# Patient Record
Sex: Male | Born: 2000 | Race: Black or African American | Hispanic: No | Marital: Single | Smoking: Never smoker
Health system: Southern US, Community
[De-identification: ages and names within clinical notes are randomized; demographics above are authoritative.]

## PROBLEM LIST (undated history)

## (undated) DIAGNOSIS — F909 Attention-deficit hyperactivity disorder, unspecified type: Secondary | ICD-10-CM

## (undated) HISTORY — DX: Attention-deficit hyperactivity disorder, unspecified type: F90.9

---
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
## 2020-04-19 ENCOUNTER — Ambulatory Visit (INDEPENDENT_AMBULATORY_CARE_PROVIDER_SITE_OTHER): Admitting: Family Medicine

## 2020-04-19 ENCOUNTER — Other Ambulatory Visit: Payer: Self-pay

## 2020-04-19 ENCOUNTER — Encounter: Payer: Self-pay | Admitting: Family Medicine

## 2020-04-19 VITALS — BP 118/78 | HR 53 | Temp 98.1°F | Ht 73.5 in | Wt 184.0 lb

## 2020-04-19 DIAGNOSIS — Z114 Encounter for screening for human immunodeficiency virus [HIV]: Secondary | ICD-10-CM | POA: Diagnosis not present

## 2020-04-19 DIAGNOSIS — Z113 Encounter for screening for infections with a predominantly sexual mode of transmission: Secondary | ICD-10-CM | POA: Diagnosis not present

## 2020-04-19 DIAGNOSIS — Z23 Encounter for immunization: Secondary | ICD-10-CM

## 2020-04-19 DIAGNOSIS — L309 Dermatitis, unspecified: Secondary | ICD-10-CM | POA: Insufficient documentation

## 2020-04-19 DIAGNOSIS — F900 Attention-deficit hyperactivity disorder, predominantly inattentive type: Secondary | ICD-10-CM

## 2020-04-19 DIAGNOSIS — Z1159 Encounter for screening for other viral diseases: Secondary | ICD-10-CM

## 2020-04-19 DIAGNOSIS — L308 Other specified dermatitis: Secondary | ICD-10-CM

## 2020-04-19 NOTE — Assessment & Plan Note (Signed)
Trial of hydrocortisone as mild symptoms. Call if not improved and would do triamcinolone.

## 2020-04-19 NOTE — Progress Notes (Signed)
Subjective:     Anthony Blackburn is a 19 y.o. male presenting for Establish Care, Medication Refill (Adderall at a lower dose ), Referral (dermatology), and std testing     HPI  #ADHD - notes some difficulty focusing on tasks - is able to sit still w/o issues - having some difficulty focusing on home tasks  - does well at work  - sometimes he feels down  #STI testing - typically wears a condom  Review of Systems   Social History   Tobacco Use  Smoking Status Never Smoker  Smokeless Tobacco Never Used        Objective:    BP Readings from Last 3 Encounters:  04/19/20 118/78  12/12/17 114/60  05/30/17 (!) 108/58 (11 %, Z = -1.24 /  10 %, Z = -1.28)*   *BP percentiles are based on the 2017 AAP Clinical Practice Guideline for boys   Wt Readings from Last 3 Encounters:  04/19/20 184 lb (83.5 kg)  12/12/17 171 lb 3.2 oz (77.7 kg) (77 %, Z= 0.75)*  05/30/17 156 lb 6.4 oz (70.9 kg) (63 %, Z= 0.34)*   * Growth percentiles are based on CDC (Boys, 2-20 Years) data.    BP 118/78   Pulse (!) 53   Temp 98.1 F (36.7 C) (Temporal)   Ht 6' 1.5" (1.867 m)   Wt 184 lb (83.5 kg)   SpO2 98%   BMI 23.95 kg/m    Physical Exam Constitutional:      Appearance: Normal appearance. He is not ill-appearing or diaphoretic.  HENT:     Right Ear: External ear normal.     Left Ear: External ear normal.     Nose: Nose normal.  Eyes:     General: No scleral icterus.    Extraocular Movements: Extraocular movements intact.     Conjunctiva/sclera: Conjunctivae normal.  Cardiovascular:     Rate and Rhythm: Normal rate.  Pulmonary:     Effort: Pulmonary effort is normal.  Musculoskeletal:     Cervical back: Neck supple.  Skin:    General: Skin is warm and dry.     Comments: Small papules and dry skin around the nose and upper lip  Neurological:     Mental Status: He is alert. Mental status is at baseline.  Psychiatric:        Mood and Affect: Mood normal.            Assessment & Plan:   Problem List Items Addressed This Visit      Musculoskeletal and Integument   Eczema    Trial of hydrocortisone as mild symptoms. Call if not improved and would do triamcinolone.         Other   Attention deficit hyperactivity disorder (ADHD) - Primary    Rare symptoms reported on the adult symptom questionnaire and he reports doing well at work. Discussed watch and wait and if symptoms worsen/persist then return. He plans to enroll in school in Fall 2022 so may anticipate he will need medication while in school       Other Visit Diagnoses    Need for influenza vaccination       Relevant Orders   Flu Vaccine QUAD 36+ mos IM   Need for hepatitis C screening test       Relevant Orders   Hepatitis C antibody   Encounter for screening for HIV       Relevant Orders   HIV Antibody (routine testing w rflx)  Routine screening for STI (sexually transmitted infection)       Relevant Orders   C. trachomatis/N. gonorrhoeae RNA   RPR       Return in about 8 months (around 12/17/2020) for annual wellness and ADHD check in.  Lynnda Child, MD  This visit occurred during the SARS-CoV-2 public health emergency.  Safety protocols were in place, including screening questions prior to the visit, additional usage of staff PPE, and extensive cleaning of exam room while observing appropriate contact time as indicated for disinfecting solutions.

## 2020-04-19 NOTE — Assessment & Plan Note (Signed)
Rare symptoms reported on the adult symptom questionnaire and he reports doing well at work. Discussed watch and wait and if symptoms worsen/persist then return. He plans to enroll in school in Fall 2022 so may anticipate he will need medication while in school

## 2020-04-19 NOTE — Patient Instructions (Addendum)
Eczema  - try hydrocortisone cream over the counter - Call or MyChart if not improved - can send prescription for triamcinolone cream   Strategies that help with ADHD  1) To-Do List - Make a daily list of tasks you want to accomplish - break large tasks down into smaller ones that you can do with your attention span - Categorize tasks using ABC system:  ---"A" tasks are of most importance and need to be done more immediately ---"B" tasks may be important, but can wait ---"C" tasks are often easiest, but should not be done first  2) Time Management: set a timer for completing certain tasks. Consider using this to monitor breaks between tasks  3) Delaying Distractibility: When trying to focus on a task, consider having a small notebook to jot down ideas that interrupt concentration. This way you don't forget something and can redirect attention to the immediate task  4) Mindfulness: Try to do only one thing at a time. Multitasking often leads to mistakes and distractions.   5) Minimize Distractions: Limit potential distractions while you are trying to complete tasks. Quiet place, no background noise, consider turning off your phone.   6) Visual Reminders: Use sticky notes, white board, or small notebook to remind yourself of tasks -- and keep in ONE obvious place  7) Reminders/alerts: Set timers or alerts to reminder yourself of appointments.   8) Calendar/planner: Keep a calendar to maintain your schedule as well as manage your time. Put all deadlines, appointments, etc in the planner and schedule blocks of time for projects at home (exercising, and other goals). Make "appointments" with that time to protect that time for those tasks specifically.   Self-help Materials 1) Resources:  -- Building control surveyor but Scattered Guide to Success: How to Use Your Brains' Executive Skills to Keep Up, Stay Calm, and Get Organized at Work and at Home by Arita Miss and [place] -- Out of the Fog: Treatment Options  and Coping Strategies for Adult Attention Deficit Disorder by Bettina Gavia and Mickel Fuchs -- Driven to Distraction (Revised): Recognizing and Coping with Attention Deficit Disorder by Antonieta Loveless and Alvie Heidelberg -- Taking Charge of Adult ADHD by Janese Banks -- www.chadd.org and www.PinkCheek.be  2) Deep breathing - can be done in 3-5 minutes throughout the day.  -- Take a deep breath in through the nose (for 4 seconds), noticing yoru stomach expands as you inhale and take along breath out (for 6 seconds) noticing that your stomach deflates as you exhale. Apps like Calm and Breath2Relax provide short, guided breathing exercises.   3) Mindfulness: The goal is to concentrate on the present moment in a descriptive and non-judgmental manner -- Books: The Mindfulness Prescription for Adult ADHD: An 8-Step Program for Strengthening Your Attention, Managing Emotions, and Achieving Your Goals by Milas Kocher -- Apps: Insight Timer, Headspace, and Buddhify2 -- Websites: www.mindful.org and franticworld.com/free-meditations-from-mindfulness and YouTube videos by Charlene Brooke such as "Three Minute Breathing Space" -- Yoga practice   Healthy Habits 1) Try to maintain a healthy lifestyle -- eating lots of fruits and veggies and getting regular exercise  2) Good Sleep Hygiene Habits -- Got to bed and wake up within an hour of the same time every day -- Avoid bright screens (from laptop, phone, TV) within at least 30 minutes before bed. The "blue light" supresses the sleep hormone melatonin and the content may stimulate as well -- Avoid heavy foods and caffeine within at least 2 hours of bedtime -- Maintain a quiet  and dark sleep environment (blackout curtains, turn on a fan or white noise to block out disruptive sounds) -- Practicing relaxing activites before bed (taking a shower, reading a book, journaling, meditation app) -- To quiet a busy mid -- consider journaling before bed (jotting down  reminders, worry thoughts, as well as positive things like a gratitude list)

## 2020-04-22 LAB — HEPATITIS C ANTIBODY
Hepatitis C Ab: NONREACTIVE
SIGNAL TO CUT-OFF: 0.01 (ref <1.00)

## 2020-04-22 LAB — C. TRACHOMATIS/N. GONORRHOEAE RNA
C. trachomatis RNA, TMA: NOT DETECTED
N. gonorrhoeae RNA, TMA: DETECTED — AB

## 2020-04-22 LAB — RPR: RPR Ser Ql: NONREACTIVE

## 2020-04-22 LAB — HIV ANTIBODY (ROUTINE TESTING W REFLEX): HIV 1&2 Ab, 4th Generation: NONREACTIVE

## 2020-04-27 ENCOUNTER — Other Ambulatory Visit: Payer: Self-pay

## 2020-04-27 ENCOUNTER — Ambulatory Visit (INDEPENDENT_AMBULATORY_CARE_PROVIDER_SITE_OTHER)

## 2020-04-27 DIAGNOSIS — R369 Urethral discharge, unspecified: Secondary | ICD-10-CM

## 2020-04-27 MED ORDER — CEFTRIAXONE SODIUM 1 G IJ SOLR: 1.0000 g | Freq: Once | INTRAMUSCULAR | Status: DC

## 2020-04-27 MED ORDER — CEFTRIAXONE SODIUM 1 G IJ SOLR
1.0000 g | Freq: Once | INTRAMUSCULAR | Status: AC
  Administered 2020-04-27: 500 mg via INTRAMUSCULAR

## 2020-04-27 NOTE — Progress Notes (Signed)
Per orders of Dr. Selena Batten, injection of Ceftriaxone 500mg  reconstituted with 1% Lidocaine administered deep IM to Right ventrogluteal by , RN. Patient tolerated injection well.  Patient did wait 15-20 minutes following administration to ensure no reaction. Also, patient reviewed questions regarding treatment for gonorrhea with PCP, Dr. Kizzie Ide.

## 2020-12-14 ENCOUNTER — Other Ambulatory Visit: Payer: Self-pay

## 2020-12-14 ENCOUNTER — Ambulatory Visit (INDEPENDENT_AMBULATORY_CARE_PROVIDER_SITE_OTHER): Payer: Self-pay | Admitting: Family Medicine

## 2020-12-14 ENCOUNTER — Encounter: Payer: Self-pay | Admitting: Family Medicine

## 2020-12-14 VITALS — BP 126/70 | HR 61 | Temp 97.9°F | Ht 73.5 in | Wt 176.0 lb

## 2020-12-14 DIAGNOSIS — Z0000 Encounter for general adult medical examination without abnormal findings: Secondary | ICD-10-CM

## 2020-12-14 DIAGNOSIS — Z23 Encounter for immunization: Secondary | ICD-10-CM

## 2020-12-14 NOTE — Progress Notes (Signed)
Annual Exam   Chief Complaint:  Chief Complaint  Patient presents with   Annual Exam    History of Present Illness:  Anthony Blackburn is a 20 y.o. presents today for annual examination.     Nutrition/Lifestyle Diet: 1 meal per day, so busy with work so hard to eat Exercise: active job He is multiple partners, contraception - condoms most of the time and has sex with females.  Any issues getting or maintaining an erection? yes  Social History   Tobacco Use  Smoking Status Never  Smokeless Tobacco Never   Social History   Substance and Sexual Activity  Alcohol Use Yes   Comment: less than once per month   Social History   Substance and Sexual Activity  Drug Use Not Currently   Types: Marijuana   Comment: 2 months since last use     Safety The patient wears seatbelts: yes.     The patient feels safe at home and in their relationships: yes.  General Health Dentist in the last year: Yes Eye doctor: not applicable  Weight Wt Readings from Last 3 Encounters:  12/14/20 176 lb (79.8 kg)  04/19/20 184 lb (83.5 kg)  12/12/17 171 lb 3.2 oz (77.7 kg) (77 %, Z= 0.75)*   * Growth percentiles are based on CDC (Boys, 2-20 Years) data.   Patient has normal BMI  BMI Readings from Last 1 Encounters:  12/14/20 22.91 kg/m     Chronic disease screening Blood pressure monitoring:  BP Readings from Last 3 Encounters:  12/14/20 126/70  04/19/20 118/78  12/12/17 114/60    Lipid Monitoring: Indication for screening: age >35, obesity, diabetes, family hx, CV risk factors.  Lipid screening: Not Indicated  No results found for: CHOL, HDL, LDLCALC, LDLDIRECT, TRIG, CHOLHDL   Diabetes Screening: age >91, overweight, family hx, PCOS, hx of gestational diabetes, at risk ethnicity, elevated blood pressure >135/80.  Diabetes Screening screening: Not Indicated  No results found for: HGBA1C   Immunization History  Administered Date(s) Administered   DTaP 01/16/2001,  01/26/2004, 03/02/2006   HPV 9-valent 04/19/2020   Hepatitis A 12/21/2004, 03/02/2006   Hepatitis B 08/30/1999, 12/30/1999, 03/14/2000   HiB (PRP-OMP) 08/30/1999, 12/30/1999, 03/14/2000   IPV 01/25/2001, 01/17/2004, 03/02/2006   Influenza, Seasonal, Injecte, Preservative Fre 07/08/2012   Influenza,inj,Quad PF,6+ Mos 02/12/2013, 03/08/2015, 06/20/2016, 05/30/2017, 04/19/2020   MMR 01/26/2004, 03/02/2006   Pneumococcal Conjugate-13 08/30/1999, 12/30/1999   Tdap 11/28/2010   Varicella 06/20/2000, 03/02/2006    History reviewed. No pertinent past medical history.  History reviewed. No pertinent surgical history.  Prior to Admission medications   Medication Sig Start Date End Date Taking? Authorizing Provider  doxycycline (VIBRAMYCIN) 100 MG capsule Take by mouth. 12/12/20 12/19/20 Yes [provider]    Allergies  Allergen Reactions   Pork-Derived Products      Social History   Socioeconomic History   Marital status: Single    Spouse name: Not on file   Number of children: 0   Years of education: Not on file   Highest education level: Not on file  Occupational History   Not on file  Tobacco Use   Smoking status: Never   Smokeless tobacco: Never  Vaping Use   Vaping Use: Some days  Substance and Sexual Activity   Alcohol use: Yes    Comment: less than once per month   Drug use: Not Currently    Types: Marijuana    Comment: 2 months since last use   Sexual activity:  Yes    Birth control/protection: Condom    Comment: w/ women  Other Topics Concern   Not on file  Social History Narrative   04/19/20   From: the area   Living: with parents   Work: Psychologist, educational, 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         Family: lives with parents, one sister      Enjoys: relax, playing basketball      Exercise: occasional basketball game, no regular routine   Diet: not good, does eat home cooked - a lot of fast food       Safety   Seat belts: Yes    Guns: Yes  and secure   Safe in relationships: Yes    Social Determinants of Health   Financial Resource Strain: Not on file  Food Insecurity: Not on file  Transportation Needs: Not on file  Physical Activity: Not on file  Stress: Not on file  Social Connections: Not on file  Intimate Partner Violence: Not on file    Family History  Problem Relation Age of Onset   Heart attack Maternal Grandmother 21   ADD / ADHD Sister    ADD / ADHD Mother     Review of Systems  Constitutional:  Negative for chills and fever.  HENT:  Negative for congestion and sore throat.   Eyes:  Negative for blurred vision and double vision.  Respiratory:  Negative for shortness of breath.   Cardiovascular:  Negative for chest pain.  Gastrointestinal:  Negative for heartburn, nausea and vomiting.  Genitourinary: Negative.   Musculoskeletal: Negative.  Negative for myalgias.  Skin:  Negative for rash.  Neurological:  Negative for dizziness and headaches.  Endo/Heme/Allergies:  Does not bruise/bleed easily.  Psychiatric/Behavioral:  Negative for depression. The patient is not nervous/anxious.     Physical Exam BP 126/70   Pulse 61   Temp 97.9 F (36.6 C) (Temporal)   Ht 6' 1.5" (1.867 m)   Wt 176 lb (79.8 kg)   SpO2 97%   BMI 22.91 kg/m    BP Readings from Last 3 Encounters:  12/14/20 126/70  04/19/20 118/78  12/12/17 114/60      Physical Exam Constitutional:      General: He is not in acute distress.    Appearance: He is well-developed. He is not diaphoretic.  HENT:     Head: Normocephalic and atraumatic.     Right Ear: Tympanic membrane and ear canal normal.     Left Ear: Tympanic membrane and ear canal normal.     Nose: Nose normal.     Mouth/Throat:     Pharynx: Uvula midline.  Eyes:     General: No scleral icterus.    Conjunctiva/sclera: Conjunctivae normal.     Pupils: Pupils are equal, round, and reactive to light.  Cardiovascular:     Rate  and Rhythm: Normal rate and regular rhythm.     Heart sounds: Normal heart sounds. No murmur heard. Pulmonary:     Effort: Pulmonary effort is normal. No respiratory distress.     Breath sounds: Normal breath sounds. No wheezing.  Abdominal:     General: Bowel sounds are normal. There is no distension.     Palpations: Abdomen is soft. There is no mass.     Tenderness: There is no abdominal tenderness. There is no guarding.  Musculoskeletal:        General: Normal range of motion.  Cervical back: Normal range of motion and neck supple.  Lymphadenopathy:     Cervical: No cervical adenopathy.  Skin:    General: Skin is warm and dry.     Capillary Refill: Capillary refill takes less than 2 seconds.  Neurological:     Mental Status: He is alert and oriented to person, place, and time.       Results:  PHQ-9:  [place] Office Visit from 04/19/2020 in [place] [facility] [place]  PHQ-9 Total Score 5         Assessment: 20 y.o. here for routine annual physical examination.  Plan: Problem List Items Addressed This Visit   None Visit Diagnoses     Annual physical exam    -  Primary       Screening: -- Blood pressure screen normal -- cholesterol screening: not due for screening -- Weight screening: normal -- Diabetes Screening: not due for screening -- Nutrition: Encouraged healthy diet and exercise  The ASCVD Risk score Mikey Bussing DC Jr., et al., 2013) failed to calculate for the following reasons:   The 2013 ASCVD risk score is only valid for ages 29 to 91  -- Statin therapy for Age 85-75 with CVD risk >7.5%  Psych -- Depression screening (PHQ-9):  [place] Office Visit from 04/19/2020 in [place] at [facility]  PHQ-9 Total Score 5        Safety -- tobacco screening: not using -- alcohol screening:  low-risk usage. -- no evidence of domestic violence or intimate partner violence.   Cancer Screening -- No age related cancer  screening due  Immunizations Immunization History  Administered Date(s) Administered   DTaP 01/16/2001, 01/26/2004, 03/02/2006   HPV 9-valent 04/19/2020   Hepatitis A 12/21/2004, 03/02/2006   Hepatitis B 08/30/1999, 12/30/1999, 03/14/2000   HiB (PRP-OMP) 08/30/1999, 12/30/1999, 03/14/2000   IPV 01/25/2001, 01/17/2004, 03/02/2006   Influenza, Seasonal, Injecte, Preservative Fre 07/08/2012   Influenza,inj,Quad PF,6+ Mos 02/12/2013, 03/08/2015, 06/20/2016, 05/30/2017, 04/19/2020   MMR 01/26/2004, 03/02/2006   Pneumococcal Conjugate-13 08/30/1999, 12/30/1999   Tdap 11/28/2010   Varicella 06/20/2000, 03/02/2006    -- flu vaccine not up to date - not in season -- TDAP q10 years not up to date - will get today -- Covid-19 Vaccine pt declined   Encouraged regular vision and dental screening. Encouraged healthy exercise and diet.   Lesleigh Noe

## 2020-12-14 NOTE — Patient Instructions (Signed)
Preventive Care 21-21 Years Old, Male Preventive care refers to lifestyle choices and visits with your health care provider that can promote health and wellness. This includes: A yearly physical exam. This is also called an annual wellness visit. Regular dental and eye exams. Immunizations. Screening for certain conditions. Healthy lifestyle choices, such as: Eating a healthy diet. Getting regular exercise. Not using drugs or products that contain nicotine and tobacco. Limiting alcohol use. What can I expect for my preventive care visit? Physical exam Your health care provider may check your: Height and weight. These may be used to calculate your BMI (body mass index). BMI is a measurement that tells if you are at a healthy weight. Heart rate and blood pressure. Body temperature. Skin for abnormal spots. Counseling Your health care provider may ask you questions about your: Past medical problems. Family's medical history. Alcohol, tobacco, and drug use. Emotional well-being. Home life and relationship well-being. Sexual activity. Diet, exercise, and sleep habits. Work and work environment. Access to firearms. What immunizations do I need?  Vaccines are usually given at various ages, according to a schedule. Your health care provider will recommend vaccines for you based on your age, medicalhistory, and lifestyle or other factors, such as travel or where you work. What tests do I need? Blood tests Lipid and cholesterol levels. These may be checked every 5 years starting at age 20. Hepatitis C test. Hepatitis B test. Screening  Diabetes screening. This is done by checking your blood sugar (glucose) after you have not eaten for a while (fasting). Genital exam to check for testicular cancer or hernias. STD (sexually transmitted disease) testing, if you are at risk. Talk with your health care provider about your test results, treatment options,and if necessary, the need for more  tests. Follow these instructions at home: Eating and drinking  Eat a healthy diet that includes fresh fruits and vegetables, whole grains, lean protein, and low-fat dairy products. Drink enough fluid to keep your urine pale yellow. Take vitamin and mineral supplements as recommended by your health care provider. Do not drink alcohol if your health care provider tells you not to drink. If you drink alcohol: Limit how much you have to 0-2 drinks a day. Be aware of how much alcohol is in your drink. In the [place] one drink equals one 12 oz bottle of beer (355 mL), one 5 oz glass of wine (148 mL), or one 1 oz glass of hard liquor (44 mL).  Lifestyle Take daily care of your teeth and gums. Brush your teeth every morning and night with fluoride toothpaste. Floss one time each day. Stay active. Exercise for at least 30 minutes 5 or more days each week. Do not use any products that contain nicotine or tobacco, such as cigarettes, e-cigarettes, and chewing tobacco. If you need help quitting, ask your health care provider. Do not use drugs. If you are sexually active, practice safe sex. Use a condom or other form of protection to prevent STIs (sexually transmitted infections). Find healthy ways to cope with stress, such as: Meditation, yoga, or listening to music. Journaling. Talking to a trusted person. Spending time with friends and family. Safety Always wear your seat belt while driving or riding in a vehicle. Do not drive: If you have been drinking alcohol. Do not ride with someone who has been drinking. When you are tired or distracted. While texting. Wear a helmet and other protective equipment during sports activities. If you have firearms in your house, make sure   you follow all gun safety procedures. Seek help if you have been physically or sexually abused. What's next? Go to your health care provider once a year for an annual wellness visit. Ask your health care provider how often  you should have your eyes and teeth checked. Stay up to date on all vaccines. This information is not intended to replace advice given to you by your health care provider. Make sure you discuss any questions you have with your healthcare provider. Document Revised: 01/29/2019 Document Reviewed: 05/09/2018 Elsevier Patient Education  2022 Elsevier Inc.  

## 2022-08-01 ENCOUNTER — Encounter: Payer: Self-pay | Admitting: Nurse Practitioner

## 2022-08-01 ENCOUNTER — Ambulatory Visit: Payer: BC Managed Care – PPO | Admitting: Nurse Practitioner

## 2022-08-01 VITALS — BP 122/76 | HR 59 | Temp 98.6°F | Ht 75.0 in | Wt 199.2 lb

## 2022-08-01 DIAGNOSIS — F909 Attention-deficit hyperactivity disorder, unspecified type: Secondary | ICD-10-CM

## 2022-08-01 DIAGNOSIS — Z79899 Other long term (current) drug therapy: Secondary | ICD-10-CM

## 2022-08-01 DIAGNOSIS — Z23 Encounter for immunization: Secondary | ICD-10-CM | POA: Diagnosis not present

## 2022-08-01 MED ORDER — AMPHETAMINE-DEXTROAMPHET ER 20 MG PO CP24: 20.0000 mg | ORAL_CAPSULE | Freq: Every day | ORAL | 0 refills | Status: AC

## 2022-08-01 NOTE — Assessment & Plan Note (Signed)
Chronic, not controlled.  He is going back to school and would like to restart his Adderall.  Will have him start on Adderall XR 20 mg daily.  PDMP reviewed.  Discussed that this is a controlled substance and controlled substance agreement signed.  Discussed possible side effects.  Follow-up in 3 months.

## 2022-08-01 NOTE — Progress Notes (Signed)
New Patient Visit  BP 122/76 (BP Location: Right Arm)   Pulse (!) 59   Temp 98.6 F (37 C)   Ht '6\' 3"'$  (1.905 m)   Wt 199 lb 3.2 oz (90.4 kg)   SpO2 98%   BMI 24.90 kg/m    Subjective:    Patient ID: Anthony Blackburn, male    DOB: [birth year], 22 y.o.   MRN: [id number]  CC: Chief Complaint  Patient presents with   Transitions Of Care    Wants to start back on Adderall    HPI: Anthony Blackburn is a 22 y.o. male presents to transfer care to a new provider.  Introduced to Designer, jewellery role and practice setting.  All questions answered.  Discussed provider/patient relationship and expectations.  He has a history of ADHD. He is going back to school and woud like to restart adderall. He was taking adderall XR 30 mg daily, however doesn't know if he would need that dose again. He endorses trouble concentrating and staying on task.   Depression and Anxiety Screen Done:     08/01/2022    8:28 AM 04/19/2020   12:50 PM 12/12/2017    9:01 AM  Depression screen PHQ 2/9  Decreased Interest 2 1 0  Down, Depressed, Hopeless 1 1 0  PHQ - 2 Score 3 2 0  Altered sleeping 0 1   Tired, decreased energy 1 1   Change in appetite 0 0   Feeling bad or failure about yourself  0 1   Trouble concentrating 1 0   Moving slowly or fidgety/restless 2 0   Suicidal thoughts 0 0   PHQ-9 Score 7 5   Difficult doing work/chores Very difficult Not difficult at all       08/01/2022    8:29 AM  GAD 7 : Generalized Anxiety Score  Nervous, Anxious, on Edge 1  Control/stop worrying 1  Worry too much - different things 2  Trouble relaxing 3  Restless 2  Easily annoyed or irritable 2  Afraid - awful might happen 0  Total GAD 7 Score 11  Anxiety Difficulty Very difficult     Past Medical History:  Diagnosis Date   ADHD     History reviewed. No pertinent surgical history.  Family History  Problem Relation Age of Onset   Heart attack Maternal Grandmother 59   ADD / ADHD Sister    ADD /  ADHD Mother      Social History   Tobacco Use   Smoking status: Never   Smokeless tobacco: Never  Vaping Use   Vaping Use: Some days  Substance Use Topics   Alcohol use: Yes    Comment: less than once per month   Drug use: Not Currently    Types: Marijuana    Comment: 2 months since last use    No current outpatient medications on file prior to visit.   No current facility-administered medications on file prior to visit.     Review of Systems  Constitutional: Negative.   HENT: Negative.    Eyes: Negative.   Respiratory: Negative.    Cardiovascular: Negative.   Gastrointestinal: Negative.   Genitourinary: Negative.   Musculoskeletal: Negative.   Skin: Negative.   Neurological: Negative.   Psychiatric/Behavioral: Negative.        Objective:    BP 122/76 (BP Location: Right Arm)   Pulse (!) 59   Temp 98.6 F (37 C)   Ht '6\' 3"'$  (1.905 m)   Wt  199 lb 3.2 oz (90.4 kg)   SpO2 98%   BMI 24.90 kg/m   Wt Readings from Last 3 Encounters:  08/01/22 199 lb 3.2 oz (90.4 kg)  12/14/20 176 lb (79.8 kg)  04/19/20 184 lb (83.5 kg)    BP Readings from Last 3 Encounters:  08/01/22 122/76  12/14/20 126/70  04/19/20 118/78    Physical Exam Vitals and nursing note reviewed.  Constitutional:      General: He is not in acute distress.    Appearance: Normal appearance.  HENT:     Head: Normocephalic and atraumatic.     Right Ear: Tympanic membrane, ear canal and external ear normal.     Left Ear: Tympanic membrane, ear canal and external ear normal.  Eyes:     Conjunctiva/sclera: Conjunctivae normal.  Cardiovascular:     Rate and Rhythm: Normal rate and regular rhythm.     Pulses: Normal pulses.     Heart sounds: Normal heart sounds.  Pulmonary:     Effort: Pulmonary effort is normal.     Breath sounds: Normal breath sounds.  Abdominal:     Palpations: Abdomen is soft.     Tenderness: There is no abdominal tenderness.  Musculoskeletal:        General: Normal range  of motion.     Cervical back: Normal range of motion and neck supple. No tenderness.     Right lower leg: No edema.     Left lower leg: No edema.  Lymphadenopathy:     Cervical: No cervical adenopathy.  Skin:    General: Skin is warm and dry.  Neurological:     General: No focal deficit present.     Mental Status: He is alert and oriented to person, place, and time.     Cranial Nerves: No cranial nerve deficit.     Coordination: Coordination normal.     Gait: Gait normal.  Psychiatric:        Mood and Affect: Mood normal.        Behavior: Behavior normal.        Thought Content: Thought content normal.        Judgment: Judgment normal.        Assessment & Plan:   Problem List Items Addressed This Visit       Other   Attention deficit hyperactivity disorder (ADHD)    Chronic, not controlled.  He is going back to school and would like to restart his Adderall.  Will have him start on Adderall XR 20 mg daily.  PDMP reviewed.  Discussed that this is a controlled substance and controlled substance agreement signed.  Discussed possible side effects.  Follow-up in 3 months.      Relevant Orders   DRUG MONITORING, PANEL 8 WITH CONFIRMATION, URINE   Controlled substance agreement signed    Controlled substance agreement signed for Adderall XR 20 mg daily.  Will check urine drug screen today.      Relevant Orders   DRUG MONITORING, PANEL 8 WITH CONFIRMATION, URINE   Other Visit Diagnoses     Immunization due    -  Primary   HPV #3 given today   Relevant Orders   HPV 9-valent vaccine,Recombinat (Completed)        Follow up plan: Return in about 3 months (around 11/01/2022) for adhd.

## 2022-08-01 NOTE — Assessment & Plan Note (Signed)
Controlled substance agreement signed for Adderall XR 20 mg daily.  Will check urine drug screen today.

## 2022-08-01 NOTE — Patient Instructions (Signed)
It was great to see you!  Start adderall XR 1 tablet ('20mg'$ ) daily as needed.   Let's follow-up in 3 months, sooner if you have concerns.  If a referral was placed today, you will be contacted for an appointment. Please note that routine referrals can sometimes take up to 3-4 weeks to process. Please call our office if you haven't heard anything after this time frame.  Take care,  Vance Peper, NP

## 2022-08-03 LAB — DRUG MONITORING, PANEL 8 WITH CONFIRMATION, URINE
6 Acetylmorphine: NEGATIVE ng/mL (ref <10)
Alcohol Metabolites: NEGATIVE ng/mL (ref <500)
Amphetamines: NEGATIVE ng/mL (ref <500)
Benzodiazepines: NEGATIVE ng/mL (ref <100)
Buprenorphine, Urine: NEGATIVE ng/mL (ref <5)
Cocaine Metabolite: NEGATIVE ng/mL (ref <150)
Creatinine: 188.3 mg/dL (ref >=20.0)
MDMA: NEGATIVE ng/mL (ref <500)
Marijuana Metabolite: 7 ng/mL — ABNORMAL HIGH (ref <5)
Marijuana Metabolite: POSITIVE ng/mL — AB (ref <20)
Opiates: NEGATIVE ng/mL (ref <100)
Oxidant: NEGATIVE ug/mL (ref <200)
Oxycodone: NEGATIVE ng/mL (ref <100)
pH: 7.4 (ref 4.5–9.0)

## 2022-08-03 LAB — DM TEMPLATE

## 2022-11-01 ENCOUNTER — Encounter: Payer: Self-pay | Admitting: Family Medicine

## 2022-11-01 ENCOUNTER — Ambulatory Visit (INDEPENDENT_AMBULATORY_CARE_PROVIDER_SITE_OTHER): Payer: BC Managed Care – PPO | Admitting: Family Medicine

## 2022-11-01 VITALS — BP 120/74 | HR 96 | Temp 98.1°F | Ht 75.0 in | Wt 201.0 lb

## 2022-11-01 DIAGNOSIS — F909 Attention-deficit hyperactivity disorder, unspecified type: Secondary | ICD-10-CM

## 2022-11-01 NOTE — Progress Notes (Signed)
   Established Patient Office Visit   Subjective:  Patient ID: Anthony Blackburn, male    DOB: Mar 18, 2001  Age: 22 y.o. MRN: [id number]  Chief Complaint  Patient presents with   Medical Management of Chronic Issues    3 month f/u ADHD.  No concerns.     HPI Encounter Diagnoses  Name Primary?   Attention deficit hyperactivity disorder (ADHD), unspecified ADHD type Yes   For follow-up of above.  Has been taking Adderall intermittently.  Has not really noticed any difference with or without the medication.  He is back in school studying computer science and doing well without it.  He is aware of his recently positive drug screen.  He has been working on cleaning that area of his life.  He would like to try life without stimulants.  He has a history of attention deficit disorder since childhood.   Review of Systems  Constitutional: Negative.   HENT: Negative.    Eyes:  Negative for blurred vision, discharge and redness.  Respiratory: Negative.    Cardiovascular: Negative.   Gastrointestinal:  Negative for abdominal pain.  Genitourinary: Negative.   Musculoskeletal: Negative.  Negative for myalgias.  Skin:  Negative for rash.  Neurological:  Negative for tingling, loss of consciousness and weakness.  Endo/Heme/Allergies:  Negative for polydipsia.     Current Outpatient Medications:    amphetamine-dextroamphetamine (ADDERALL XR) 20 MG 24 hr capsule, Take 1 capsule (20 mg total) by mouth daily., Disp: 30 capsule, Rfl: 0   Objective:     BP 120/74   Pulse 96   Temp 98.1 F (36.7 C) (Temporal)   Ht 6\' 3"  (1.905 m)   Wt 201 lb (91.2 kg)   SpO2 97%   BMI 25.12 kg/m    Physical Exam Constitutional:      General: He is not in acute distress.    Appearance: Normal appearance. He is not ill-appearing, toxic-appearing or diaphoretic.  HENT:     Head: Normocephalic and atraumatic.     Right Ear: External ear normal.     Left Ear: External ear normal.  Eyes:     General: No  scleral icterus.       Right eye: No discharge.        Left eye: No discharge.     Extraocular Movements: Extraocular movements intact.     Conjunctiva/sclera: Conjunctivae normal.  Pulmonary:     Effort: Pulmonary effort is normal. No respiratory distress.  Skin:    General: Skin is warm and dry.  Neurological:     Mental Status: He is alert and oriented to person, place, and time.  Psychiatric:        Mood and Affect: Mood normal.        Behavior: Behavior normal.      No results found for any visits on 11/01/22.    The ASCVD Risk score (Arnett DK, et al., 2019) failed to calculate for the following reasons:   The 2019 ASCVD risk score is only valid for ages 53 to 38    Assessment & Plan:   Attention deficit hyperactivity disorder (ADHD), unspecified ADHD type    Return if symptoms worsen or fail to improve.  Advised that there are other medications available for attention deficit.  He would like to try school without it entirely.  He will follow back up as needed.  Mliss Sax, MD

## 2023-02-05 ENCOUNTER — Encounter: Payer: Self-pay | Admitting: Family Medicine

## 2023-02-05 ENCOUNTER — Ambulatory Visit (INDEPENDENT_AMBULATORY_CARE_PROVIDER_SITE_OTHER): Payer: 59 | Admitting: Family Medicine

## 2023-02-05 VITALS — BP 124/68 | HR 62 | Resp 18 | Ht 75.0 in | Wt 193.8 lb

## 2023-02-05 DIAGNOSIS — G8929 Other chronic pain: Secondary | ICD-10-CM | POA: Diagnosis not present

## 2023-02-05 DIAGNOSIS — M25572 Pain in left ankle and joints of left foot: Secondary | ICD-10-CM | POA: Diagnosis not present

## 2023-02-05 MED ORDER — MELOXICAM 7.5 MG PO TABS: 7.5000 mg | ORAL_TABLET | Freq: Every day | ORAL | 0 refills | Status: DC

## 2023-02-05 NOTE — Progress Notes (Signed)
Established Patient Office Visit   Subjective:  Patient ID: Anthony Blackburn, male    DOB: 2000/12/08  Age: 22 y.o. MRN: [id number]  Chief Complaint  Patient presents with   Follow-up    L ankle pain onset 02/01/2023, pt has had ankle problem in the past  Has gone to UC, however pt is still having pain  No fall, pain is from wear and tear  Would like a referral to orthopedist     HPI Encounter Diagnoses  Name Primary?   Chronic pain of left ankle Yes   Ongoing history of left ankle pain.  The left leg foot is what he lives with.  History of inversions in the past.  It does seem to be a weak ankle.  There has been pain radiating up of the knee to the medial distal tibia.  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   Review of Systems  Constitutional: Negative.   HENT: Negative.    Eyes:  Negative for blurred vision, discharge and redness.  Respiratory: Negative.    Cardiovascular: Negative.   Gastrointestinal:  Negative for abdominal pain.  Genitourinary: Negative.   Musculoskeletal:  Positive for joint pain. Negative for myalgias.  Skin:  Negative for rash.  Neurological:  Negative for tingling, loss of consciousness and weakness.  Endo/Heme/Allergies:  Negative for polydipsia.     Current Outpatient Medications:    amphetamine-dextroamphetamine (ADDERALL XR) 20 MG 24 hr capsule, Take 1 capsule (20 mg total) by mouth daily., Disp: 30 capsule, Rfl: 0   meloxicam (MOBIC) 7.5 MG tablet, Take 1 tablet (7.5 mg total) by mouth daily., Disp: 30 tablet, Rfl: 0   Objective:     BP 124/68 (BP Location: Left Arm, Patient Position: Sitting, Cuff Size: Normal)   Pulse 62   Resp 18   Ht 6\' 3"  (1.905 m)   Wt 193 lb 12.8 oz (87.9 kg)   SpO2 97%   BMI 24.22 kg/m    Physical Exam Constitutional:      General: He is not in acute distress.    Appearance: Normal appearance. He is not ill-appearing, toxic-appearing or diaphoretic.  HENT:     Head: Normocephalic and atraumatic.      Right Ear: External ear normal.     Left Ear: External ear normal.  Eyes:     General: No scleral icterus.       Right eye: No discharge.        Left eye: No discharge.     Extraocular Movements: Extraocular movements intact.     Conjunctiva/sclera: Conjunctivae normal.  Pulmonary:     Effort: Pulmonary effort is normal. No respiratory distress.  Musculoskeletal:     Left ankle: No swelling. Tenderness present. No base of 5th metatarsal tenderness. Normal range of motion. Anterior drawer test negative. Normal pulse.     Left Achilles Tendon: No tenderness or defects. Thompson's test negative.     Comments: Pain in the lateral malleolus with varus stress.  There was some laxity varus well.  Skin:    General: Skin is warm and dry.  Neurological:     Mental Status: He is alert and oriented to person, place, and time.  Psychiatric:        Mood and Affect: Mood normal.        Behavior: Behavior normal.      No results found for any visits on 02/05/23.    The ASCVD Risk score (Arnett DK, et al., 2019) failed to calculate  for the following reasons:   The 2019 ASCVD risk score is only valid for ages 72 to 42    Assessment & Plan:   Chronic pain of left ankle -     Meloxicam; Take 1 tablet (7.5 mg total) by mouth daily.  Dispense: 30 tablet; Refill: 0 -     Ambulatory referral to Orthopedic Surgery    No follow-ups on file.  Referred to orthopedics.  Will start on meloxicam 7.5 daily.  Mliss Sax, MD

## 2023-02-08 ENCOUNTER — Encounter: Payer: Self-pay | Admitting: Family Medicine

## 2023-03-04 ENCOUNTER — Other Ambulatory Visit: Payer: Self-pay | Admitting: Family Medicine

## 2023-03-04 DIAGNOSIS — G8929 Other chronic pain: Secondary | ICD-10-CM
